# Patient Record
Sex: Female | Born: 1950
Health system: Southern US, Community
[De-identification: ages and names within clinical notes are randomized; demographics above are authoritative.]

## PROBLEM LIST (undated history)

## (undated) DIAGNOSIS — H269 Unspecified cataract: Secondary | ICD-10-CM

## (undated) DIAGNOSIS — E785 Hyperlipidemia, unspecified: Secondary | ICD-10-CM

## (undated) DIAGNOSIS — I10 Essential (primary) hypertension: Secondary | ICD-10-CM

## (undated) DIAGNOSIS — Z90710 Acquired absence of both cervix and uterus: Secondary | ICD-10-CM

## (undated) HISTORY — DX: Acquired absence of both cervix and uterus: Z90.710

## (undated) HISTORY — PX: COLONOSCOPY: SHX174

## (undated) HISTORY — PX: ABDOMINAL HYSTERECTOMY: SHX81

## (undated) HISTORY — DX: Essential (primary) hypertension: I10

## (undated) HISTORY — DX: Hyperlipidemia, unspecified: E78.5

## (undated) HISTORY — DX: Unspecified cataract: H26.9

---

## 1998-05-13 ENCOUNTER — Other Ambulatory Visit: Admission: RE | Admit: 1998-05-13 | Discharge: 1998-05-13 | Payer: Self-pay | Admitting: *Deleted

## 1999-05-27 ENCOUNTER — Encounter: Admission: RE | Admit: 1999-05-27 | Discharge: 1999-05-27 | Payer: Self-pay | Admitting: Obstetrics and Gynecology

## 1999-05-27 ENCOUNTER — Encounter: Payer: Self-pay | Admitting: Obstetrics and Gynecology

## 1999-06-07 ENCOUNTER — Other Ambulatory Visit: Admission: RE | Admit: 1999-06-07 | Discharge: 1999-06-07 | Payer: Self-pay | Admitting: Emergency Medicine

## 2000-05-30 ENCOUNTER — Encounter: Admission: RE | Admit: 2000-05-30 | Discharge: 2000-05-30 | Payer: Self-pay | Admitting: Obstetrics and Gynecology

## 2000-05-30 ENCOUNTER — Encounter: Payer: Self-pay | Admitting: Obstetrics and Gynecology

## 2001-06-14 ENCOUNTER — Encounter: Payer: Self-pay | Admitting: Obstetrics and Gynecology

## 2001-06-14 ENCOUNTER — Encounter: Admission: RE | Admit: 2001-06-14 | Discharge: 2001-06-14 | Payer: Self-pay | Admitting: Obstetrics and Gynecology

## 2002-06-26 ENCOUNTER — Encounter: Payer: Self-pay | Admitting: Obstetrics and Gynecology

## 2002-06-26 ENCOUNTER — Encounter: Admission: RE | Admit: 2002-06-26 | Discharge: 2002-06-26 | Payer: Self-pay | Admitting: Obstetrics and Gynecology

## 2004-07-12 ENCOUNTER — Encounter: Admission: RE | Admit: 2004-07-12 | Discharge: 2004-07-12 | Payer: Self-pay | Admitting: Obstetrics and Gynecology

## 2010-05-29 ENCOUNTER — Encounter: Payer: Self-pay | Admitting: Obstetrics and Gynecology

## 2010-05-30 ENCOUNTER — Encounter: Payer: Self-pay | Admitting: Family Medicine

## 2013-03-25 ENCOUNTER — Ambulatory Visit: Payer: Self-pay | Admitting: Internal Medicine

## 2017-03-21 DIAGNOSIS — R946 Abnormal results of thyroid function studies: Secondary | ICD-10-CM | POA: Diagnosis not present

## 2017-03-21 DIAGNOSIS — R69 Illness, unspecified: Secondary | ICD-10-CM | POA: Diagnosis not present

## 2017-03-21 DIAGNOSIS — E7800 Pure hypercholesterolemia, unspecified: Secondary | ICD-10-CM | POA: Diagnosis not present

## 2017-03-21 DIAGNOSIS — Z780 Asymptomatic menopausal state: Secondary | ICD-10-CM | POA: Diagnosis not present

## 2017-03-21 DIAGNOSIS — I10 Essential (primary) hypertension: Secondary | ICD-10-CM | POA: Diagnosis not present

## 2017-05-16 DIAGNOSIS — Z1231 Encounter for screening mammogram for malignant neoplasm of breast: Secondary | ICD-10-CM | POA: Diagnosis not present

## 2017-05-16 DIAGNOSIS — Z780 Asymptomatic menopausal state: Secondary | ICD-10-CM | POA: Diagnosis not present

## 2017-05-16 DIAGNOSIS — M8589 Other specified disorders of bone density and structure, multiple sites: Secondary | ICD-10-CM | POA: Diagnosis not present

## 2017-06-22 DIAGNOSIS — R946 Abnormal results of thyroid function studies: Secondary | ICD-10-CM | POA: Diagnosis not present

## 2017-06-22 DIAGNOSIS — R945 Abnormal results of liver function studies: Secondary | ICD-10-CM | POA: Diagnosis not present

## 2017-06-22 DIAGNOSIS — E7800 Pure hypercholesterolemia, unspecified: Secondary | ICD-10-CM | POA: Diagnosis not present

## 2018-03-16 DIAGNOSIS — R69 Illness, unspecified: Secondary | ICD-10-CM | POA: Diagnosis not present

## 2018-03-16 DIAGNOSIS — R946 Abnormal results of thyroid function studies: Secondary | ICD-10-CM | POA: Diagnosis not present

## 2018-03-16 DIAGNOSIS — E7800 Pure hypercholesterolemia, unspecified: Secondary | ICD-10-CM | POA: Diagnosis not present

## 2018-03-16 DIAGNOSIS — I10 Essential (primary) hypertension: Secondary | ICD-10-CM | POA: Diagnosis not present

## 2018-05-21 DIAGNOSIS — Z803 Family history of malignant neoplasm of breast: Secondary | ICD-10-CM | POA: Diagnosis not present

## 2018-05-21 DIAGNOSIS — Z1231 Encounter for screening mammogram for malignant neoplasm of breast: Secondary | ICD-10-CM | POA: Diagnosis not present

## 2019-01-01 ENCOUNTER — Encounter: Payer: Self-pay | Admitting: Gastroenterology

## 2019-01-03 ENCOUNTER — Other Ambulatory Visit: Payer: Self-pay

## 2019-01-03 ENCOUNTER — Emergency Department (HOSPITAL_COMMUNITY)
Admission: EM | Admit: 2019-01-03 | Discharge: 2019-01-03 | Disposition: A | Discharge status: Home / Self Care | Payer: Medicare HMO | Attending: Emergency Medicine | Admitting: Emergency Medicine

## 2019-01-03 ENCOUNTER — Encounter (HOSPITAL_COMMUNITY): Payer: Self-pay | Admitting: Emergency Medicine

## 2019-01-03 ENCOUNTER — Emergency Department (HOSPITAL_COMMUNITY): Payer: Medicare HMO

## 2019-01-03 DIAGNOSIS — R030 Elevated blood-pressure reading, without diagnosis of hypertension: Secondary | ICD-10-CM

## 2019-01-03 DIAGNOSIS — K644 Residual hemorrhoidal skin tags: Secondary | ICD-10-CM | POA: Diagnosis not present

## 2019-01-03 DIAGNOSIS — K5900 Constipation, unspecified: Secondary | ICD-10-CM

## 2019-01-03 NOTE — ED Notes (Signed)
Patient verbalizes understanding of discharge instructions . Opportunity for questions and answers were provided . Armband removed by staff ,Pt discharged from ED. W/C  offered at D/C  and Declined W/C at D/C and was escorted to lobby by RN.  

## 2019-01-03 NOTE — Discharge Instructions (Addendum)
It was our pleasure to provide your ER care today - we hope that you feel better.  Drink plenty of fluids. Get adequate fiber in diet. Take colace (stool softener) 2x/day, and miralax (laxative) 1x/day as need - both of these medications are available over the counter at your local pharmacy.   Follow up with primary care doctor in the next 1-2 weeks - also have your blood pressure rechecked then, as it is high today.  Return to ER if worse, new symptoms, severe abdominal pain, persistent vomiting, other concern.

## 2019-01-03 NOTE — ED Provider Notes (Signed)
[facility] EMERGENCY DEPARTMENT Provider Note   CSN: [id number] Arrival date & time: 01/03/19  1107     History   Chief Complaint Chief Complaint  Patient presents with  . Constipation    HPI Kathleen Mccullough is a 68 y.o. female.     Patient c/o problems w constipation in the past few weeks. Notes hx same in past, stating her normal is 2-3 bms per week. States hx hemorrhoids in past, and mild discomfort by anus. Denies new med use or opiate med use. Denies abd pain or distension. Normal appetite. No nausea or vomiting. Last bm was a few days ago, small, sl loose. Is passing gas. Denies dysuria or gu c/o. No fever or chills. Tried a laxative 2 weeks ago for same - did have small bm afterwards. No fever or chills.   The history is provided by the patient.  Constipation Associated symptoms: no abdominal pain, no back pain, no fever and no vomiting     History reviewed. No pertinent past medical history.  There are no active problems to display for this patient.   History reviewed. No pertinent surgical history.   OB History   No obstetric history on file.      Home Medications    Prior to Admission medications   Not on File    Family History No family history on file.  Social History Social History   Tobacco Use  . Smoking status: Not on file  Substance Use Topics  . Alcohol use: Not on file  . Drug use: Not on file     Allergies   Patient has no known allergies.   Review of Systems Review of Systems  Constitutional: Negative for chills and fever.  HENT: Negative for sore throat.   Eyes: Negative for redness.  Respiratory: Negative for shortness of breath.   Cardiovascular: Negative for chest pain.  Gastrointestinal: Positive for constipation. Negative for abdominal distention, abdominal pain and vomiting.  Genitourinary: Negative for flank pain.  Musculoskeletal: Negative for back pain.  Skin: Negative for rash.  Neurological:  Negative for headaches.  Hematological: Does not bruise/bleed easily.  Psychiatric/Behavioral: Negative for confusion.     Physical Exam Updated Vital Signs BP (!) 147/107   Pulse 86   Temp 99.3 F (37.4 C) (Oral)   Resp 18   SpO2 96%   Physical Exam Vitals signs and nursing note reviewed.  Constitutional:      Appearance: Normal appearance. She is well-developed.  HENT:     Head: Atraumatic.     Nose: Nose normal.     Mouth/Throat:     Mouth: Mucous membranes are moist.  Eyes:     General: No scleral icterus.    Conjunctiva/sclera: Conjunctivae normal.  Neck:     Musculoskeletal: Normal range of motion and neck supple. No neck rigidity or muscular tenderness.     Trachea: No tracheal deviation.  Cardiovascular:     Rate and Rhythm: Normal rate.     Pulses: Normal pulses.  Pulmonary:     Effort: Pulmonary effort is normal. No respiratory distress.  Abdominal:     General: Bowel sounds are normal. There is no distension.     Palpations: Abdomen is soft. There is no mass.     Tenderness: There is no abdominal tenderness. There is no guarding.     Hernia: No hernia is present.  Genitourinary:    Comments: No cva tenderness. Rectal, w chaperone (registration), small external hemorrhoid, not acutely  thrombosed. No rectal mass felt. Small amt soft light brown stool, no impaction felt.  Musculoskeletal:        General: No swelling.  Skin:    General: Skin is warm and dry.     Findings: No rash.  Neurological:     Mental Status: She is alert.     Comments: Alert, speech normal. Steady gait.   Psychiatric:        Mood and Affect: Mood normal.      ED Treatments / Results  Labs (all labs ordered are listed, but only abnormal results are displayed) Labs Reviewed - No data to display  EKG None  Radiology Dg Abd 1 View  Result Date: 01/03/2019 CLINICAL DATA:  Constipation.  Abdominal discomfort.  Nausea. EXAM: ABDOMEN - 1 VIEW COMPARISON:  None. FINDINGS: The  bowel gas pattern is normal. No radio-opaque calculi or other significant radiographic abnormality are seen. IMPRESSION: Benign-appearing abdomen. Electronically Signed   By: Lorriane Shire M.D.   On: 01/03/2019 12:17    Procedures Procedures (including critical care time)  Medications Ordered in ED Medications - No data to display   Initial Impression / Assessment and Plan / ED Course  I have reviewed the triage vital signs and the nursing notes.  Pertinent labs & imaging results that were available during my care of the patient were reviewed by me and considered in my medical decision making (see chart for details).  Xrays.  Reviewed nursing notes and prior charts for additional history.   No impaction on rectal.  Xrays reviewed by me - no sbo. Some stool.   Will try colace and miralax rx for constipation. Also pcp f/u as bp is high today.   Return precautions provided.     Final Clinical Impressions(s) / ED Diagnoses   Final diagnoses:  None    ED Discharge Orders    None       Lajean Saver, MD 01/03/19 1355

## 2019-01-03 NOTE — ED Notes (Signed)
Out for imaging. °

## 2019-01-03 NOTE — ED Triage Notes (Signed)
Pt reports one month of consitpation pt has tried OTC with out relief . Pt has called her PCP who is out of office today. Pt reports cholesterol medications cause constipation.

## 2019-01-03 NOTE — ED Triage Notes (Signed)
Pt here from home with c/o constipation , pt has taken several OTC meds with minimal relief

## 2019-02-06 ENCOUNTER — Ambulatory Visit: Payer: Medicare HMO | Admitting: Gastroenterology

## 2019-02-06 ENCOUNTER — Other Ambulatory Visit (INDEPENDENT_AMBULATORY_CARE_PROVIDER_SITE_OTHER): Payer: Medicare HMO

## 2019-02-06 ENCOUNTER — Encounter: Payer: Self-pay | Admitting: Gastroenterology

## 2019-02-06 VITALS — BP 122/80 | HR 75 | Temp 98.3°F | Ht 64.0 in | Wt 157.0 lb

## 2019-02-06 DIAGNOSIS — K649 Unspecified hemorrhoids: Secondary | ICD-10-CM

## 2019-02-06 DIAGNOSIS — K5900 Constipation, unspecified: Secondary | ICD-10-CM

## 2019-02-06 LAB — COMPREHENSIVE METABOLIC PANEL
ALT: 9 U/L (ref 0–35)
AST: 14 U/L (ref 0–37)
Albumin: 4.4 g/dL (ref 3.5–5.2)
Alkaline Phosphatase: 85 U/L (ref 39–117)
BUN: 16 mg/dL (ref 6–23)
CO2: 29 mEq/L (ref 19–32)
Calcium: 10 mg/dL (ref 8.4–10.5)
Chloride: 102 mEq/L (ref 96–112)
Creatinine, Ser: 0.84 mg/dL (ref 0.40–1.20)
GFR: 67.6 mL/min (ref >60.00)
Glucose, Bld: 105 mg/dL — ABNORMAL HIGH (ref 70–99)
Potassium: 3.8 mEq/L (ref 3.5–5.1)
Sodium: 139 mEq/L (ref 135–145)
Total Bilirubin: 0.9 mg/dL (ref 0.2–1.2)
Total Protein: 8 g/dL (ref 6.0–8.3)

## 2019-02-06 LAB — TSH: TSH: 5.4 u[IU]/mL — ABNORMAL HIGH (ref 0.35–4.50)

## 2019-02-06 MED ORDER — NA SULFATE-K SULFATE-MG SULF 17.5-3.13-1.6 GM/177ML PO SOLN: 1.0000 | ORAL | 0 refills | Status: AC

## 2019-02-06 NOTE — Patient Instructions (Addendum)
I have recommended some labs and an x-ray test to further evaluate your constipation.  Please follow a high fiber diet. Drink at least 64 ounces of water every day.  Continue to take Miralax every day. Add a stool bulking agent such as Metamucil. I would expect this to give your stools some more bulk.   We will proceed with colonoscopy for evaluation of your symptoms and given your family history of colon cancer and polyps.   Please call with any questions or concerns prior to that time.   We have given you a sitz marker test today you need to come back and have an xray done on Monday morning.   Fiber Chart  You should be consuming 25-30g of fiber per day and drinking 8 glasses of water to help your bowels move regularly.  In the chart below you can look up how much fiber you are getting in an average day.  If you are not getting enough fiber, you should add a fiber supplement to your diet.  Examples of this include Metamucil, FiberCon and Citrucel.  These can be purchased at your local grocery store or pharmacy.      [email or web address]

## 2019-02-06 NOTE — Progress Notes (Signed)
Referring Provider: Kathyrn Lass, MD Primary Care Physician:  Kathyrn Lass, MD  Reason for Consultation: Hemorrhoids   IMPRESSION:  Change in bowel habits temporally associated with started statin therapy Chronic constipation - improved on Miralax Hemorrhoids with intermittent bleeding Family history of polyps (father) Family history of colon cancer (paternal grandfather) Screening colonoscopy 2015 ([facility])  Longstanding chronic constipation now with a change in bowel habits likely related to statin therapies. No alarm features.  She is due colonoscopy given her family history of colon polyps.  PLAN: Obtain records from [facility] ([facility]) TSH, CMP SitzMark study Drink at least 64 ounces of water every day, follow a high diet Trial of stool bulking agents such as Metamucil  Continue daily Miralax Colonoscopy with two day prep Consider trial of Linzess in the future  I consented the patient discussing the risks, benefits, and alternatives to endoscopic evaluation. In particular, we discussed the risks that include, but are not limited to, reaction to medication, cardiopulmonary compromise, bleeding requiring blood transfusion, aspiration resulting in pneumonia, perforation requiring surgery, lack of diagnosis, severe illness requiring hospitalization, and even death. We reviewed the risk of missed lesion including polyps or even cancer. The patient acknowledges these risks and asks that we proceed.  Please see the "Patient Instructions" section for addition details about the plan.  HPI: Kathleen Mccullough is a 68 y.o. female presents for evaluation of hemorrhoids. 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 Recently lost her job due to coronavirus. She is self-referred. Previously followed by Dr. Watt Climes and had a screening colonoscopy in 2015.   Longstanding history of constipation but she was able to control it with a high fiber diet.  Constipation was exacerbated after starting statins  last year.  Occurred with both Lipitor and Crestor.  It is become so severe that she was recently seen in the emergency room for constipation after not having a bowel movement for several weeks.  An abdominal x-ray at that time was normal.  No impaction noted on rectal exam.  She was treated with Colace and MiraLAX. She was asked to follow-up with her primary care provider as her blood pressure was noted to be elevated.  Baseline bowel habits are 2-3 bowel movements per week. No urge to defecate. Bowel movements are now "scraggly" and small. Associated straining that is exacerbating her hemorrhoids. Now with a daily BM on Miralax, but, she is concerned that it is still not formed. "It's a big glop."  Some blood. No mucous. No history of manual assistance with defecation.  No other associated symptoms. No identified exacerbating or relieving features.   She asked specifically if she might have H pylori after searching her symptoms on the internet.     Does not know birth mother's family history. Paternal grandfather with colon cancer at age 92. Father with colon polyps. No other known family history of colon cancer or polyps. No family history of uterine/endometrial cancer, pancreatic cancer or gastric/stomach cancer.  Abdominal imaging: Abdominal x-ray 01/03/2019: normal.    Past Medical History:  Diagnosis Date  . H/O abdominal hysterectomy     Past Surgical History:  Procedure Laterality Date  . COLONOSCOPY      Current Outpatient Medications  Medication Sig Dispense Refill  . amLODipine (NORVASC) 10 MG tablet Take 10 mg by mouth daily.    . hydrochlorothiazide (HYDRODIURIL) 25 MG tablet Take 12.5 mg by mouth daily.    . propranolol ER (INDERAL LA) 120 MG 24 hr capsule Take 120 mg by  mouth daily.     No current facility-administered medications for this visit.     Allergies as of 02/06/2019  . (No Known Allergies)    History reviewed. No pertinent family history.  Social  History   Socioeconomic History  . Marital status: Married    Spouse name: richard  . Number of children: 2  . Years of education: Not on file  . Highest education level: Not on file  Occupational History  . Not on file  Social Needs  . Financial resource strain: Not on file  . Food insecurity    Worry: Not on file    Inability: Not on file  . Transportation needs    Medical: Not on file    Non-medical: Not on file  Tobacco Use  . Smoking status: Never Smoker  . Smokeless tobacco: Never Used  Substance and Sexual Activity  . Alcohol use: Not Currently  . Drug use: Not Currently  . Sexual activity: Yes  Lifestyle  . Physical activity    Days per week: Not on file    Minutes per session: Not on file  . Stress: Not on file  Relationships  . Social Herbalist on phone: Not on file    Gets together: Not on file    Attends religious service: Not on file    Active member of club or organization: Not on file    Attends meetings of clubs or organizations: Not on file    Relationship status: Not on file  . Intimate partner violence    Fear of current or ex partner: Not on file    Emotionally abused: Not on file    Physically abused: Not on file    Forced sexual activity: Not on file  Other Topics Concern  . Not on file  Social History Narrative  . Not on file    Review of Systems: 12 system ROS is negative except as noted above.   Physical Exam: General:   Alert,  well-nourished, pleasant and cooperative in NAD Head:  Normocephalic and atraumatic. Eyes:  Sclera clear, no icterus.   Conjunctiva pink. Ears:  Normal auditory acuity. Nose:  No deformity, discharge,  or lesions. Mouth:  No deformity or lesions.   Neck:  Supple; no masses or thyromegaly. Lungs:  Clear throughout to auscultation.   No wheezes. Heart:  Regular rate and rhythm; no murmurs. Abdomen:  Soft,nontender, nondistended, normal bowel sounds, no rebound or guarding. No hepatosplenomegaly.    Rectal:  Deferred until the time of the colonoscopy Msk:  Symmetrical. No boney deformities LAD: No inguinal or umbilical LAD Extremities:  No clubbing or edema. Neurologic:  Alert and  oriented x4;  grossly nonfocal Skin:  Intact without significant lesions or rashes. Psych:  Alert and cooperative. Normal mood and affect.    Zanden Colver L. Tarri Glenn, MD, MPH 02/06/2019, 9:33 AM

## 2019-02-11 ENCOUNTER — Telehealth: Payer: Self-pay | Admitting: Gastroenterology

## 2019-02-11 ENCOUNTER — Ambulatory Visit (INDEPENDENT_AMBULATORY_CARE_PROVIDER_SITE_OTHER)
Admission: RE | Admit: 2019-02-11 | Discharge: 2019-02-11 | Disposition: A | Discharge status: Home / Self Care | Payer: Medicare HMO | Source: Ambulatory Visit | Attending: Gastroenterology | Admitting: Gastroenterology

## 2019-02-11 ENCOUNTER — Other Ambulatory Visit: Payer: Self-pay

## 2019-02-11 DIAGNOSIS — K649 Unspecified hemorrhoids: Secondary | ICD-10-CM | POA: Diagnosis not present

## 2019-02-11 DIAGNOSIS — K5900 Constipation, unspecified: Secondary | ICD-10-CM

## 2019-02-11 NOTE — Telephone Encounter (Signed)
Called patient back, answered all questions, verbalized understanding. No other questions or concerns voiced by the conclusion of the call.

## 2019-02-22 DIAGNOSIS — E7800 Pure hypercholesterolemia, unspecified: Secondary | ICD-10-CM | POA: Diagnosis not present

## 2019-02-22 DIAGNOSIS — R946 Abnormal results of thyroid function studies: Secondary | ICD-10-CM | POA: Diagnosis not present

## 2019-02-22 DIAGNOSIS — R7309 Other abnormal glucose: Secondary | ICD-10-CM | POA: Diagnosis not present

## 2019-02-25 DIAGNOSIS — H2513 Age-related nuclear cataract, bilateral: Secondary | ICD-10-CM | POA: Diagnosis not present

## 2019-02-25 DIAGNOSIS — H524 Presbyopia: Secondary | ICD-10-CM | POA: Diagnosis not present

## 2019-02-25 DIAGNOSIS — H5203 Hypermetropia, bilateral: Secondary | ICD-10-CM | POA: Diagnosis not present

## 2019-02-28 ENCOUNTER — Encounter: Payer: Self-pay | Admitting: Gastroenterology

## 2019-03-04 ENCOUNTER — Telehealth: Payer: Self-pay

## 2019-03-04 NOTE — Telephone Encounter (Signed)
Covid-19 screening questions   Do you now or have you had a fever in the last 14 days?  Do you have any respiratory symptoms of shortness of breath or cough now or in the last 14 days?  Do you have any family members or close contacts with diagnosed or suspected Covid-19 in the past 14 days?  Have you been tested for Covid-19 and found to be positive?       

## 2019-03-05 ENCOUNTER — Ambulatory Visit (AMBULATORY_SURGERY_CENTER): Payer: Medicare HMO | Admitting: Gastroenterology

## 2019-03-05 ENCOUNTER — Other Ambulatory Visit: Payer: Self-pay

## 2019-03-05 ENCOUNTER — Encounter: Payer: Self-pay | Admitting: Gastroenterology

## 2019-03-05 VITALS — BP 117/79 | HR 55 | Temp 98.6°F | Resp 12 | Ht 64.0 in | Wt 157.0 lb

## 2019-03-05 DIAGNOSIS — K635 Polyp of colon: Secondary | ICD-10-CM | POA: Diagnosis not present

## 2019-03-05 DIAGNOSIS — R194 Change in bowel habit: Secondary | ICD-10-CM | POA: Diagnosis not present

## 2019-03-05 DIAGNOSIS — K5900 Constipation, unspecified: Secondary | ICD-10-CM

## 2019-03-05 DIAGNOSIS — D122 Benign neoplasm of ascending colon: Secondary | ICD-10-CM

## 2019-03-05 MED ORDER — SODIUM CHLORIDE 0.9 % IV SOLN: 500.0000 mL | Freq: Once | INTRAVENOUS | Status: AC

## 2019-03-05 NOTE — Progress Notes (Signed)
Called to room to assist during endoscopic procedure.  Patient ID and intended procedure confirmed with present staff. Received instructions for my participation in the procedure from the performing physician.  

## 2019-03-05 NOTE — Progress Notes (Signed)
To PACU, VSS. Report to [email or web address] 

## 2019-03-05 NOTE — Patient Instructions (Signed)
Read all of the handouts given to you by your recovery room nurse.  You will need another colonoscopy in 5 years.Use your miralax as before.  Try to use bebefiber with at least 64oz of water per day.  YOU HAD AN ENDOSCOPIC PROCEDURE TODAY AT [facility]:   Refer to the procedure report that was given to you for any specific questions about what was found during the examination.  If the procedure report does not answer your questions, please call your gastroenterologist to clarify.  If you requested that your care partner not be given the details of your procedure findings, then the procedure report has been included in a sealed envelope for you to review at your convenience later.  YOU SHOULD EXPECT: Some feelings of bloating in the abdomen. Passage of more gas than usual.  Walking can help get rid of the air that was put into your GI tract during the procedure and reduce the bloating. If you had a lower endoscopy (such as a colonoscopy or flexible sigmoidoscopy) you may notice spotting of blood in your stool or on the toilet paper. If you underwent a bowel prep for your procedure, you may not have a normal bowel movement for a few days.  Please Note:  You might notice some irritation and congestion in your nose or some drainage.  This is from the oxygen used during your procedure.  There is no need for concern and it should clear up in a day or so.  SYMPTOMS TO REPORT IMMEDIATELY:   Following lower endoscopy (colonoscopy or flexible sigmoidoscopy):  Excessive amounts of blood in the stool  Significant tenderness or worsening of abdominal pains  Swelling of the abdomen that is new, acute  Fever of 100F or higher   For urgent or emergent issues, a gastroenterologist can be reached at any hour by calling [phone].   DIET:  We do recommend a small meal at first, but then you may proceed to your regular diet.  Drink plenty of fluids but you should avoid alcoholic beverages  for 24 hours.  Try to eat a high fiber diet.  ACTIVITY:  You should plan to take it easy for the rest of today and you should NOT DRIVE or use heavy machinery until tomorrow (because of the sedation medicines used during the test).    FOLLOW UP: Our staff will call the number listed on your records 48-72 hours following your procedure to check on you and address any questions or concerns that you may have regarding the information given to you following your procedure. If we do not reach you, we will leave a message.  We will attempt to reach you two times.  During this call, we will ask if you have developed any symptoms of COVID 19. If you develop any symptoms (ie: fever, flu-like symptoms, shortness of breath, cough etc.) before then, please call [phone].  If you test positive for Covid 19 in the 2 weeks post procedure, please call and report this information to [place].    If any biopsies were taken you will be contacted by phone or by letter within the next 1-3 weeks.  Please call us at [phone] if you have not heard about the biopsies in 3 weeks.    SIGNATURES/CONFIDENTIALITY: You and/or your care partner have signed paperwork which will be entered into your electronic medical record.  These signatures attest to the fact that that the information above on your After Visit Summary has  been reviewed and is understood.  Full responsibility of the confidentiality of this discharge information lies with you and/or your care-partner.

## 2019-03-05 NOTE — Progress Notes (Signed)
TEMP-LC  VITAL SIGNS-CW

## 2019-03-05 NOTE — Op Note (Addendum)
[facility] Patient Name: Donie Calton Procedure Date: 03/05/2019 10:38 AM MRN: [id number] Endoscopist: Thornton Park MD, MD Age: 68 Referring MD:  Date of Birth: 12/16/1950 Gender: Female Account #: [id number] Procedure:                Colonoscopy Indications:              Change in bowel habits temporally associated with                            started statin therapy                           Chronic constipation - improved on Miralax                           Hemorrhoids with intermittent bleeding                           Family history of polyps (father)                           Family history of colon cancer (paternal                            grandfather)                           Screening colonoscopy 2015 ([facility]) Medicines:                See the Anesthesia note for documentation of the                            administered medications Procedure:                Pre-Anesthesia Assessment:                           - Prior to the procedure, a History and Physical                            was performed, and patient medications and                            allergies were reviewed. The patient's tolerance of                            previous anesthesia was also reviewed. The risks                            and benefits of the procedure and the sedation                            options and risks were discussed with the patient.                            All questions were answered, and informed consent  was obtained. Prior Anticoagulants: The patient has                            taken no previous anticoagulant or antiplatelet                            agents. ASA Grade Assessment: III - A patient with                            severe systemic disease. After reviewing the risks                            and benefits, the patient was deemed in                            satisfactory condition to undergo the procedure.                          After obtaining informed consent, the colonoscope                            was passed under direct vision. Throughout the                            procedure, the patient's blood pressure, pulse, and                            oxygen saturations were monitored continuously. The                            Colonoscope was introduced through the anus and                            advanced to the the terminal ileum, with                            identification of the appendiceal orifice and IC                            valve. A second forward view of the right colon was                            performed. The colonoscopy was performed without                            difficulty. The patient tolerated the procedure                            well. The quality of the bowel preparation was                            good. The terminal ileum, ileocecal valve,  appendiceal orifice, and rectum were photographed. Scope In: 11:00:10 AM Scope Out: 11:15:35 AM Scope Withdrawal Time: 0 hours 11 minutes 43 seconds  Total Procedure Duration: 0 hours 15 minutes 25 seconds  Findings:                 An anal fissure was found on perianal exam.                           A few small-mouthed diverticula were found in the                            sigmoid colon.                           A 2 mm polyp was found in the ascending colon. The                            polyp was sessile. The polyp was removed with a                            cold snare. Resection and retrieval were complete.                            Estimated blood loss: none.                           A 2 mm polyp was found in the distal ascending                            colon. The polyp was sessile. Polypectomy was                            attempted, initially using a cold snare. Polyp                            resection was incomplete with this device. This                             intervention then required a different device and                            polypectomy technique. The base of the polyp was                            then removed with a cold biopsy forceps. Resection                            and retrieval were complete.                           The exam was otherwise without abnormality on                            direct and retroflexion views. Complications:  No immediate complications. Estimated blood loss:                            Minimal. Estimated Blood Loss:     Estimated blood loss was minimal. Impression:               - Healed posterior anal fissure found on perianal                            exam.                           - Diverticulosis in the sigmoid colon.                           - One 2 mm polyp in the ascending colon, removed                            with a cold snare. Resected and retrieved.                           - One 2 mm polyp in the distal ascending colon,                            removed with a cold biopsy forceps. Resected and                            retrieved.                           - The examination was otherwise normal on direct                            and retroflexion views. Recommendation:           - Patient has a contact number available for                            emergencies. The signs and symptoms of potential                            delayed complications were discussed with the                            patient. Return to normal activities tomorrow.                            Written discharge instructions were provided to the                            patient.                           - High fiber diet. Drink at least 64 ounces of  water daily. Use Metamucil or an equivalent stool                            bulking agent every day.                           - Continue present medications including Miralax.                           -  Await pathology results.                           - Repeat colonoscopy in 5 years for surveillance                            given the family history regardless of the                            pathology results. Thornton Park MD, MD 03/05/2019 11:29:05 AM This report has been signed electronically.

## 2019-03-07 ENCOUNTER — Telehealth: Payer: Self-pay

## 2019-03-07 NOTE — Telephone Encounter (Signed)
  Follow up Call-  Call back number 03/05/2019  Post procedure Call Back phone  # [phone]  Permission to leave phone message Yes  Some recent data might be hidden     Patient questions:  Do you have a fever, pain , or abdominal swelling? No. Pain Score  0 *  Have you tolerated food without any problems? Yes.    Have you been able to return to your normal activities? Yes.    Do you have any questions about your discharge instructions: Diet   No. Medications  No. Follow up visit  No.  Do you have questions or concerns about your Care? No.  Actions: * If pain score is 4 or above: No action needed, pain <4.   1. Have you developed a fever since your procedure? no  2.   Have you had an respiratory symptoms (SOB or cough) since your procedure? no  3.   Have you tested positive for COVID 19 since your procedure no  4.   Have you had any family members/close contacts diagnosed with the COVID 19 since your procedure?  no   If yes to any of these questions please route to Joylene John, RN and Alphonsa Gin, Therapist, sports.

## 2019-03-12 DIAGNOSIS — R69 Illness, unspecified: Secondary | ICD-10-CM | POA: Diagnosis not present

## 2019-03-12 DIAGNOSIS — E663 Overweight: Secondary | ICD-10-CM | POA: Diagnosis not present

## 2019-03-12 DIAGNOSIS — E7800 Pure hypercholesterolemia, unspecified: Secondary | ICD-10-CM | POA: Diagnosis not present

## 2019-03-12 DIAGNOSIS — I10 Essential (primary) hypertension: Secondary | ICD-10-CM | POA: Diagnosis not present

## 2019-03-29 ENCOUNTER — Other Ambulatory Visit: Payer: Self-pay | Admitting: *Deleted

## 2019-03-29 DIAGNOSIS — R7989 Other specified abnormal findings of blood chemistry: Secondary | ICD-10-CM

## 2019-03-29 DIAGNOSIS — E7800 Pure hypercholesterolemia, unspecified: Secondary | ICD-10-CM

## 2019-05-06 ENCOUNTER — Other Ambulatory Visit: Payer: Self-pay

## 2019-05-07 ENCOUNTER — Ambulatory Visit (INDEPENDENT_AMBULATORY_CARE_PROVIDER_SITE_OTHER): Payer: Medicare HMO | Admitting: Internal Medicine

## 2019-05-07 ENCOUNTER — Encounter: Payer: Self-pay | Admitting: Internal Medicine

## 2019-05-07 VITALS — BP 150/100 | HR 71 | Ht 64.0 in | Wt 156.0 lb

## 2019-05-07 DIAGNOSIS — E039 Hypothyroidism, unspecified: Secondary | ICD-10-CM

## 2019-05-07 DIAGNOSIS — E7800 Pure hypercholesterolemia, unspecified: Secondary | ICD-10-CM

## 2019-05-07 DIAGNOSIS — E038 Other specified hypothyroidism: Secondary | ICD-10-CM

## 2019-05-07 LAB — T4, FREE: Free T4: 1.05 ng/dL (ref 0.60–1.60)

## 2019-05-07 LAB — TSH: TSH: 4.1 u[IU]/mL (ref 0.35–4.50)

## 2019-05-07 LAB — T3, FREE: T3, Free: 2.7 pg/mL (ref 2.3–4.2)

## 2019-05-07 NOTE — Patient Instructions (Addendum)
Please stop at the lab.  If the thyroid antibodies are high, you can try Selenium 200 mcg daily.  If we decide for levothyroxine, take every day, with water, at least 30 minutes before breakfast, separated by at least 4 hours from: - acid reflux medications - calcium - iron - multivitamins  Please think about Nexletol.  Please come back for a follow-up appointment in 4 months.  Bempedoic acid tablets What is this medicine? Bempedoic acid (BEM pe DOE ik AS id) is used to lower the level of cholesterol in the blood. It is used with other cholesterol-lowering drugs. This medicine may be used for other purposes; ask your health care provider or pharmacist if you have questions. COMMON BRAND NAME(S): NEXLETOL What should I tell my health care provider before I take this medicine? They need to know if you have any of these conditions:  gout  kidney problems  liver problems  tendon problems  an unusual or allergic reaction to bempedoic acid, other medicines, foods, dyes, or preservatives  pregnant or trying to become pregnant  breast-feeding How should I use this medicine? Take this medicine by mouth with a glass of water. Follow the directions on the prescription label. You can take it with or without food. If it upsets your stomach, take it with food. Take your doses at regular intervals. Do not take your medicine more often than directed. Talk to your pediatrician about the use of this medicine in children. Special care may be needed. Overdosage: If you think you have taken too much of this medicine contact a poison control center or emergency room at once. NOTE: This medicine is only for you. Do not share this medicine with others. What if I miss a dose? If you miss a dose, take it as soon as you can. If it is almost time for your next dose, take only that dose. Do not take double or extra doses. What may interact with this medicine? This medicine may interact with the  following medications:  simvastatin  pravastatin This list may not describe all possible interactions. Give your health care provider a list of all the medicines, herbs, non-prescription drugs, or dietary supplements you use. Also tell them if you smoke, drink alcohol, or use illegal drugs. Some items may interact with your medicine. What should I watch for while using this medicine? Visit your health care professional for regular checks on your progress. Tell your health care professional if your symptoms do not start to get better or if they get worse. You may need blood work done while you are taking this medicine. This drug is only part of a total heart-health program. Your doctor or a dietician can suggest a low-cholesterol and low-fat diet to help. Avoid alcohol and smoking, and keep a proper exercise schedule. What side effects may I notice from receiving this medicine? Side effects that you should report to your doctor or health care professional as soon as possible:  allergic reactions like skin rash, itching or hives, swelling of the face, lips, or tongue  signs of gout such as swollen, red, warm, or tender joints, especially in the toes  signs of tendon problems such as tendon pain or swelling or if you are unable to move a joint Side effects that usually do not require medical attention (report these to your doctor or health care professional if they continue or are bothersome):  back pain  cold or flu-like symptoms  muscle spasms  stomach pain This list  may not describe all possible side effects. Call your doctor for medical advice about side effects. You may report side effects to FDA at 1-800-FDA-1088. Where should I keep my medicine? Keep out of the reach of children. Store at room temperature between 20 and 25 degrees C (68 and 77 degrees F). Keep this medicine in the original container. Do not throw out the packet in the container. It keeps the medicine dry. Throw away  any unused medication after the expiration date. NOTE: This sheet is a summary. It may not cover all possible information. If you have questions about this medicine, talk to your doctor, pharmacist, or health care provider.  2020 Elsevier/Gold Standard (2018-07-04 16:19:16)

## 2019-05-07 NOTE — Progress Notes (Signed)
Patient ID: Kathleen Mccullough, female   DOB: 04/26/1951, 68 y.o.   MRN: [id number]   This visit occurred during the SARS-CoV-2 public health emergency.  Safety protocols were in place, including screening questions prior to the visit, additional usage of staff PPE, and extensive cleaning of exam room while observing appropriate contact time as indicated for disinfecting solutions.   HPI  Kathleen Mccullough is a 68 y.o.-year-old female, referred by her PCP, Dr. Tarri Glenn, for management of subclinical hypothyroidism and  also for hyperlipidemia.  Subclinical hypothyroidism:  Pt. has been found to have a slightly high TSH dating back to approximately 2014.  She is not on levothyroxine.  I reviewed pt's thyroid tests per records brought by patient and available records in Epic: 02/22/2019: TSH 6.14, free T4 1.00 Lab Results  Component Value Date   TSH 5.40 (H) 02/06/2019  03/21/2017: TSH 6.43 02/04/2014: TPO Abs 109 (0-34) 02/28/2013: TSH 5.33  Antithyroid antibodies: No results found for: THGAB No components found for: TPOAB  Pt describes: - + fatigue - + constipation-significant when she tried statins - no weight gain - no cold intolerance - no depression - no dry skin - no hair loss  Pt denies feeling nodules in neck, hoarseness, dysphagia/odynophagia, SOB with lying down.  She has no FH of thyroid disorders. No FH of thyroid cancer.  No h/o radiation tx to head or neck. No recent use of iodine supplements.  Hyperlipidemia:  She is not on a statin >> she developed constipation: Lipitor, Crestor, Pravachol. She had to go to the ED for fecal impaction few mo ago. Colonoscopy: normal, except few polyps. Zetia was recently mentioned by PCP but she is reticent to try.   02/22/2019: 271/160/58/181  On Lipitor, her LDL decreased to the 80s in the past.  Her last HbA1c was 5.1% 02/22/2019.  She has a h/o HTN, osteopenia (05/16/2017: T-score -1.9), vit D insufficiency: 02/28/2013: vit D  25.6, but most recent level has been 40 reportedly.  She also has a h/o carpal tunnel.  She has a hysterectomy + 1 ovary excised at 29 for DUB.  ROS: Constitutional: no weight gain/loss, no fatigue, no subjective hyperthermia/hypothermia Eyes: no blurry vision, no xerophthalmia ENT: no sore throat, no nodules palpated in throat, no dysphagia/odynophagia, no hoarseness Cardiovascular: no CP/SOB/palpitations/leg swelling Respiratory: no cough/SOB Gastrointestinal: no N/V/D/+ C Musculoskeletal: +  Muscle soreness/no joint aches Skin: no rashes Neurological: no tremors/numbness/tingling/dizziness Psychiatric: no depression/anxiety  Past Medical History:  Diagnosis Date  . Cataract    BEGINNING STAGES BILATERAL  . H/O abdominal hysterectomy   . Hyperlipidemia   . Hypertension    Past Surgical History:  Procedure Laterality Date  . ABDOMINAL HYSTERECTOMY    . COLONOSCOPY     Social History   Socioeconomic History  . Marital status: Married    Spouse name: richard  . Number of children: 2  . Years of education: Not on file  . Highest education level: Not on file  Occupational History  .  unemployed  Tobacco Use  . Smoking status: Never Smoker  . Smokeless tobacco: Never Used  Substance and Sexual Activity  . Alcohol use: Not Currently  . Drug use: Not Currently  . Sexual activity: Yes  Other Topics Concern  . Not on file  Social History Narrative  . Not on file   Social Determinants of Health   Financial Resource Strain:   . Difficulty of Paying Living Expenses: Not on file  Food Insecurity:   . Worried About  Running Out of Food in the Last Year: Not on file  . Ran Out of Food in the Last Year: Not on file  Transportation Needs:   . Lack of Transportation (Medical): Not on file  . Lack of Transportation (Non-Medical): Not on file  Physical Activity:   . Days of Exercise per Week: Not on file  . Minutes of Exercise per Session: Not on file  Stress:   . Feeling  of Stress : Not on file  Social Connections:   . Frequency of Communication with Friends and Family: Not on file  . Frequency of Social Gatherings with Friends and Family: Not on file  . Attends Religious Services: Not on file  . Active Member of Clubs or Organizations: Not on file  . Attends Archivist Meetings: Not on file  . Marital Status: Not on file  Intimate Partner Violence:   . Fear of Current or Ex-Partner: Not on file  . Emotionally Abused: Not on file  . Physically Abused: Not on file  . Sexually Abused: Not on file   Current Outpatient Medications on File Prior to Visit  Medication Sig Dispense Refill  . amLODipine (NORVASC) 10 MG tablet Take 10 mg by mouth daily.    . hydrochlorothiazide (HYDRODIURIL) 25 MG tablet Take 12.5 mg by mouth daily.    . propranolol ER (INDERAL LA) 120 MG 24 hr capsule Take 120 mg by mouth daily.     Current Facility-Administered Medications on File Prior to Visit  Medication Dose Route Frequency Provider Last Rate Last Admin  . 0.9 %  sodium chloride infusion  500 mL Intravenous Once [place], MD       No Known Allergies Family History  Problem Relation Age of Onset  . Hypertension Father   . Hyperlipidemia Father   . Colon cancer Paternal Grandfather   . Colon cancer Cousin   . Esophageal cancer Neg Hx   . Rectal cancer Neg Hx   . Stomach cancer Neg Hx    PE: BP (!) 150/100   Pulse 71   Ht 5\' 4"  (1.626 m)   Wt 156 lb (70.8 kg)   SpO2 98%   BMI 26.78 kg/m  Wt Readings from Last 3 Encounters:  05/07/19 156 lb (70.8 kg)  03/05/19 157 lb (71.2 kg)  02/06/19 157 lb (71.2 kg)   Constitutional: overweight, in NAD Eyes: PERRLA, EOMI, no exophthalmos ENT: moist mucous membranes, no thyromegaly, no cervical lymphadenopathy Cardiovascular: RRR, No MRG Respiratory: CTA B Gastrointestinal: abdomen soft, NT, ND, BS+ Musculoskeletal: no deformities, strength intact in all 4 Skin: moist, warm, no  rashes Neurological: no tremor with outstretched hands, DTR normal in all 4  ASSESSMENT: 1.  Subclinical hypothyroidism -Due to Hashimoto's thyroiditis  2. HL  PLAN:  1. Patient with long-standing subclinical hypothyroidism, with fairly stable TSH levels slightly above the upper limit of normal and normal free thyroid hormones.  She had testing for Hashimoto's thyroiditis in 2016 and her TPO antibodies were elevated. - she appears euthyroid.  She mentions that her fatigue is mild and chronic.  She has constipation but especially when she takes statins.  No other possibly hypothyroid symptoms. - she does not appear to have a goiter, thyroid nodules, or neck compression symptoms -At this visit, we discussed about Hashimoto's thyroiditis as an autoimmune disease in which her immune system impacts her own thyroid.  We discussed that distraction of the thyroid will ultimately occur but the timing of this process is extremely  subjective.  Her TFTs have been stable now for more than 6 years.  This is a sign that her Hashimoto's thyroiditis is very indolent. -We discussed about ways to improve her antibodies including improved diet, exercise, relaxation, increased sleep, and also, selenium. -At this visit, we decided to check her TFTs (TSH, free T4, free T3) and also add TPO and ATA antibodies.  If these are elevated, I would suggest to start selenium.  Alternatively, if the antibodies are not elevated, we also have the option of starting a low-dose levothyroxine to see if she feels better from the point of view of fatigue and constipation. We discussed about correct intake of levothyroxine in case she needs to start, fasting, with water, separated by at least 30 minutes from breakfast, and separated by more than 4 hours from calcium, iron, multivitamins, acid reflux medications (PPIs). -She agrees with the plan - I will see her back in 4 months  2. HL -Patient has tried all the statins available some of  them twice, with still the same effect: Constipation.  This has been so severe that at 1 point she had to go to the emergency room for fecal impaction.  She would not want to try another statin. -Her PCP suggested ezetimibe but she is also reticent to try this due to the possible constipation -At this visit, I suggested a change in diet to reduce cholesterol and animal proteins and transition to a more plant-based diet. We discussed about ideal targets for LDL cholesterol and also total cholesterol -I also suggested Bempedoic acid but she is undecided about his.  Given written information about the medication and she will let me know if she wants to try it.  Another option would be referral to the lipid clinic for a PCSK9 inhibitor: Praluent or Repatha.  Orders Placed This Encounter  Procedures  . xtpit - TSH  . xtpit - free T4  . xtpit - free T3  . TPO Ab - thyroid  . ATA - ThyCa   Component     Latest Ref Rng & Units 05/07/2019  TSH     0.35 - 4.50 uIU/mL 4.10  T4,Free(Direct)     0.60 - 1.60 ng/dL 1.05  Triiodothyronine,Free,Serum     2.3 - 4.2 pg/mL 2.7  Thyroperoxidase Ab SerPl-aCnc     <9 IU/mL 85 (H)  Thyroglobulin Ab     < or = 1 IU/mL <1   TPO antibodies are elevated.  She can try selenium 200 mcg daily.  Rest of the tests are normal.  Philemon Kingdom, MD PhD [facility]

## 2019-05-08 LAB — THYROGLOBULIN ANTIBODY: Thyroglobulin Ab: <1 IU/mL (ref <=1)

## 2019-05-08 LAB — THYROID PEROXIDASE ANTIBODY: Thyroperoxidase Ab SerPl-aCnc: 85 IU/mL — ABNORMAL HIGH (ref <9)

## 2019-05-09 ENCOUNTER — Encounter: Payer: Self-pay | Admitting: Internal Medicine

## 2019-05-18 ENCOUNTER — Encounter: Payer: Self-pay | Admitting: Internal Medicine

## 2019-05-27 DIAGNOSIS — Z1231 Encounter for screening mammogram for malignant neoplasm of breast: Secondary | ICD-10-CM | POA: Diagnosis not present

## 2019-06-25 ENCOUNTER — Encounter: Payer: Self-pay | Admitting: Internal Medicine

## 2019-07-01 DIAGNOSIS — R69 Illness, unspecified: Secondary | ICD-10-CM | POA: Diagnosis not present

## 2019-07-06 ENCOUNTER — Encounter: Payer: Self-pay | Admitting: Internal Medicine

## 2019-07-08 ENCOUNTER — Other Ambulatory Visit: Payer: Self-pay | Admitting: Internal Medicine

## 2019-07-08 DIAGNOSIS — E038 Other specified hypothyroidism: Secondary | ICD-10-CM

## 2019-07-08 DIAGNOSIS — E039 Hypothyroidism, unspecified: Secondary | ICD-10-CM

## 2019-07-08 MED ORDER — LEVOTHYROXINE SODIUM 25 MCG PO TABS: 25.0000 ug | ORAL_TABLET | Freq: Every day | ORAL | 0 refills | Status: DC

## 2019-07-12 ENCOUNTER — Ambulatory Visit: Payer: Medicare HMO | Attending: Internal Medicine

## 2019-07-12 DIAGNOSIS — Z23 Encounter for immunization: Secondary | ICD-10-CM | POA: Insufficient documentation

## 2019-07-12 NOTE — Progress Notes (Signed)
   Covid-19 Vaccination Clinic  Name:  Kathleen Mccullough    MRN: [id number] DOB: 08-Jan-1951  07/12/2019  [name] was observed post Covid-19 immunization for 15 minutes without incident. She was provided with Vaccine Information Sheet and instruction to access the V-Safe system.   [name] was instructed to call 911 with any severe reactions post vaccine: Marland Kitchen Difficulty breathing  . Swelling of face and throat  . A fast heartbeat  . A bad rash all over body  . Dizziness and weakness

## 2019-08-01 DIAGNOSIS — R69 Illness, unspecified: Secondary | ICD-10-CM | POA: Diagnosis not present

## 2019-08-06 ENCOUNTER — Other Ambulatory Visit: Payer: Self-pay | Admitting: Internal Medicine

## 2019-08-07 ENCOUNTER — Ambulatory Visit: Payer: Medicare HMO | Attending: Internal Medicine

## 2019-08-07 DIAGNOSIS — Z23 Encounter for immunization: Secondary | ICD-10-CM

## 2019-08-07 NOTE — Progress Notes (Signed)
   Covid-19 Vaccination Clinic  Name:  Khaza Zimpfer    MRN: [id number] DOB: May 13, 1950  08/07/2019  Ms. Bashor was observed post Covid-19 immunization for 15 minutes without incident. She was provided with Vaccine Information Sheet and instruction to access the V-Safe system.   Ms. Rittenberg was instructed to call 911 with any severe reactions post vaccine: Marland Kitchen Difficulty breathing  . Swelling of face and throat  . A fast heartbeat  . A bad rash all over body  . Dizziness and weakness   Immunizations Administered    Name Date Dose VIS Date Route   Pfizer COVID-19 Vaccine 08/07/2019 12:54 PM 0.3 mL 04/19/2019 Intramuscular   Manufacturer: [place]   Lot: [id number]   [place]: [id number]

## 2019-09-06 ENCOUNTER — Other Ambulatory Visit: Payer: Self-pay

## 2019-09-10 ENCOUNTER — Other Ambulatory Visit: Payer: Self-pay

## 2019-09-10 ENCOUNTER — Ambulatory Visit (INDEPENDENT_AMBULATORY_CARE_PROVIDER_SITE_OTHER): Payer: Medicare HMO | Admitting: Internal Medicine

## 2019-09-10 ENCOUNTER — Ambulatory Visit: Payer: Medicare HMO | Admitting: Internal Medicine

## 2019-09-10 ENCOUNTER — Encounter: Payer: Self-pay | Admitting: Internal Medicine

## 2019-09-10 VITALS — BP 130/80 | HR 62 | Ht 64.0 in | Wt 159.0 lb

## 2019-09-10 DIAGNOSIS — E039 Hypothyroidism, unspecified: Secondary | ICD-10-CM | POA: Diagnosis not present

## 2019-09-10 DIAGNOSIS — R69 Illness, unspecified: Secondary | ICD-10-CM | POA: Diagnosis not present

## 2019-09-10 DIAGNOSIS — E7800 Pure hypercholesterolemia, unspecified: Secondary | ICD-10-CM | POA: Diagnosis not present

## 2019-09-10 DIAGNOSIS — E038 Other specified hypothyroidism: Secondary | ICD-10-CM

## 2019-09-10 LAB — LIPID PANEL
Cholesterol: 241 mg/dL — ABNORMAL HIGH (ref 0–200)
HDL: 55.7 mg/dL (ref >39.00)
LDL Cholesterol: 150 mg/dL — ABNORMAL HIGH (ref 0–99)
NonHDL: 185.32
Total CHOL/HDL Ratio: 4
Triglycerides: 176 mg/dL — ABNORMAL HIGH (ref 0.0–149.0)
VLDL: 35.2 mg/dL (ref 0.0–40.0)

## 2019-09-10 LAB — TSH: TSH: 3.91 u[IU]/mL (ref 0.35–4.50)

## 2019-09-10 LAB — T4, FREE: Free T4: 1.45 ng/dL (ref 0.60–1.60)

## 2019-09-10 NOTE — Patient Instructions (Addendum)
Please stop at the lab.  Continue Selenium 200 mcg daily.  Please continue Levothyroxine 25 mcg daily  Take  Levothyroxine  every day, with water, at least 30 minutes before breakfast, separated by at least 4 hours from: - acid reflux medications - calcium - iron - multivitamins  Please come back for a follow-up appointment in 6 months.

## 2019-09-10 NOTE — Progress Notes (Signed)
Patient ID: Kathleen Mccullough, female   DOB: 1951/02/27, 69 y.o.   MRN: [id number]   This visit occurred during the SARS-CoV-2 public health emergency.  Safety protocols were in place, including screening questions prior to the visit, additional usage of staff PPE, and extensive cleaning of exam room while observing appropriate contact time as indicated for disinfecting solutions.   HPI  Kathleen Mccullough is a 69 y.o.-year-old female, initially referred by her PCP, Dr. Tarri Glenn, returning for follow-up for subclinical hypothyroidism and  also for hyperlipidemia.  Last visit 4 months ago.  Subclinical hypothyroidism:  She was found to have a slightly high TSH in 2014.    After last visit, although her TSH was close to the upper limit of normal, she wanted to try a low-dose levothyroxine.  Pt is on levothyroxine 25 mcg daily, started at the end of 06/2019, taken: - in am - fasting - at least 30 min from b'fast - no Ca, Fe, PPIs - + MVI rarely around supper - not on Biotin  Reviewed available TFTs: Lab Results  Component Value Date   TSH 4.10 05/07/2019   TSH 5.40 (H) 02/06/2019   FREET4 1.05 05/07/2019   T3FREE 2.7 05/07/2019  02/22/2019: TSH 6.14, free T4 1.00 03/21/2017: TSH 6.43 02/28/2013: TSH 5.33  Antithyroid antibodies were positive: Component     Latest Ref Rng & Units 05/07/2019  Thyroperoxidase Ab SerPl-aCnc     <9 IU/mL 85 (H)  Thyroglobulin Ab     < or = 1 IU/mL <1  02/04/2014: TPO Abs 109 (0-34)  We started selenium 200 mcg daily in 04/2019.  At last visit she described fatigue and constipation.  The symptoms are chronic and still present.  Fatigue is slightly better after she started levothyroxine, but not significantly so.  Pt denies: - feeling nodules in neck - hoarseness - dysphagia - choking - SOB with lying down  No FH of thyroid disease or thyroid cancer. No h/o radiation tx to head or neck.  No seaweed or kelp. No recent contrast studies. No herbal  supplements. No Biotin use. No recent steroids use.   Hyperlipidemia: - Her LDL responded well to Lipitor, decreasing to the 80s in the past. -However, she is not on a statin due to severe constipation: In the past, she tried Lipitor, Crestor, Pravachol.  She had to go to the ED for fecal impaction. Colonoscopy: normal, except few polyps.   Zetia was mentioned by PCP but she was reticent to try.  02/22/2019: 271/160/58/181 No results found for: CHOL No results found for: HDL No results found for: LDLCALC No results found for: TRIG No results found for: CHOLHDL No results found for: LDLDIRECT  At last visit, we discussed about Bempedoic acid, but she was afraid that this was too expensive.  She also refused a referral to the lipid clinic at that time.  Her last HbA1c was 5.1% 02/22/2019.  She has a h/o HTN, osteopenia (05/16/2017: T-score -1.9), vit D insufficiency: 02/28/2013: vit D 25.6, but most recent level has been 40 reportedly.  She also has a h/o carpal tunnel.  She has a hysterectomy + 1 ovary excised at 69 for DUB.  ROS: Constitutional: no weight gain/no weight loss, no fatigue, no subjective hyperthermia, no subjective hypothermia Eyes: no blurry vision, no xerophthalmia ENT: no sore throat, + see HPI Cardiovascular: no CP/no SOB/no palpitations/no leg swelling Respiratory: no cough/no SOB/no wheezing Gastrointestinal: no N/no V/no D/no C/no acid reflux Musculoskeletal: no muscle aches/no joint aches Skin:  no rashes, no hair loss Neurological: no tremors/no numbness/no tingling/no dizziness  I reviewed pt's medications, allergies, PMH, social hx, family hx, and changes were documented in the history of present illness. Otherwise, unchanged from my initial visit note.  Past Medical History:  Diagnosis Date  . Cataract    BEGINNING STAGES BILATERAL  . H/O abdominal hysterectomy   . Hyperlipidemia   . Hypertension    Past Surgical History:  Procedure Laterality Date   . ABDOMINAL HYSTERECTOMY    . COLONOSCOPY     Social History   Socioeconomic History  . Marital status: Married    Spouse name: richard  . Number of children: 2  . Years of education: Not on file  . Highest education level: Not on file  Occupational History  .  unemployed  Tobacco Use  . Smoking status: Never Smoker  . Smokeless tobacco: Never Used  Substance and Sexual Activity  . Alcohol use: Not Currently  . Drug use: Not Currently  . Sexual activity: Yes  Other Topics Concern  . Not on file  Social History Narrative  . Not on file   Social Determinants of Health   Financial Resource Strain:   . Difficulty of Paying Living Expenses: Not on file  Food Insecurity:   . Worried About Charity fundraiser in the Last Year: Not on file  . Ran Out of Food in the Last Year: Not on file  Transportation Needs:   . Lack of Transportation (Medical): Not on file  . Lack of Transportation (Non-Medical): Not on file  Physical Activity:   . Days of Exercise per Week: Not on file  . Minutes of Exercise per Session: Not on file  Stress:   . Feeling of Stress : Not on file  Social Connections:   . Frequency of Communication with Friends and Family: Not on file  . Frequency of Social Gatherings with Friends and Family: Not on file  . Attends Religious Services: Not on file  . Active Member of Clubs or Organizations: Not on file  . Attends Archivist Meetings: Not on file  . Marital Status: Not on file  Intimate Partner Violence:   . Fear of Current or Ex-Partner: Not on file  . Emotionally Abused: Not on file  . Physically Abused: Not on file  . Sexually Abused: Not on file   Current Outpatient Medications on File Prior to Visit  Medication Sig Dispense Refill  . amLODipine (NORVASC) 10 MG tablet Take 10 mg by mouth daily.    . hydrochlorothiazide (HYDRODIURIL) 25 MG tablet Take 12.5 mg by mouth daily.    Marland Kitchen levothyroxine (SYNTHROID) 25 MCG tablet TAKE 1 TABLET BY  MOUTH DAILY BEFORE BREAKFAST. 90 tablet 0  . propranolol ER (INDERAL LA) 120 MG 24 hr capsule Take 120 mg by mouth daily.     Current Facility-Administered Medications on File Prior to Visit  Medication Dose Route Frequency Provider Last Rate Last Admin  . 0.9 %  sodium chloride infusion  500 mL Intravenous Once [place], MD       No Known Allergies Family History  Problem Relation Age of Onset  . Hypertension Father   . Hyperlipidemia Father   . Colon cancer Paternal Grandfather   . Colon cancer Cousin   . Esophageal cancer Neg Hx   . Rectal cancer Neg Hx   . Stomach cancer Neg Hx    PE: BP 130/80   Pulse 62   Ht 5\' 4"  (  1.626 m)   Wt 159 lb (72.1 kg)   SpO2 97%   BMI 27.29 kg/m  Wt Readings from Last 3 Encounters:  09/10/19 159 lb (72.1 kg)  05/07/19 156 lb (70.8 kg)  03/05/19 157 lb (71.2 kg)   Constitutional: overweight, in NAD Eyes: PERRLA, EOMI, no exophthalmos ENT: moist mucous membranes, no thyromegaly, no cervical lymphadenopathy Cardiovascular: RRR, + 1/6 SEM, no RG, + PVCs Respiratory: CTA B Gastrointestinal: abdomen soft, NT, ND, BS+ Musculoskeletal: no deformities, strength intact in all 4 Skin: moist, warm, no rashes Neurological: no tremor with outstretched hands, DTR normal in all 4  ASSESSMENT: 1.  Subclinical hypothyroidism -Due to Hashimoto's thyroiditis  2. HL  PLAN:  1. Patient with longstanding subclinical hypothyroidism with fairly stable TSH level either at or slightly above the upper limit of normal and normal free thyroid hormones.  She had testing for Hashimoto's thyroiditis in 2016 and again at last visit and her TPO antibodies were elevated.  At that time, TSH was in the normal range so I did not suggest levothyroxine but I suggested selenium 200 mcg daily in an effort to decrease her thyroid inflammation. -At last visit she was mentioning mild/chronic fatigue and also constipation.   -At last visit we discussed about Hashimoto's  thyroiditis is an autoimmune disease in which her immune system is directed against her on thyroid.  The destruction of the thyroid will ultimately occur but the timing of this process is extremely subjective.  As of now, she does not appear to require levothyroxine.  Indeed, her disease appears to be very indolent, with fairly stable thyroid test over the last 6 to 7 years. -Since last visit, she sent me a message that she wanted to try a low-dose of levothyroxine.  We started 25 mcg daily. - pt feels does not feel a big difference in her symptoms on this dose, but maybe has more energy - we discussed about taking the thyroid hormone every day, with water, >30 minutes before breakfast, separated by >4 hours from acid reflux medications, calcium, iron, multivitamins. Pt. is taking it correctly. - will check thyroid tests today: TSH and fT4 and will also check her TPO antibodies to see if they responded to selenium.  If they did, we will continue selenium.  If the level is higher, will stop it. - She agrees to try a slightly higher dose of levothyroxine if the TSH is still slightly high in the normal range.  She does not feel a difference in her symptoms on this higher dose, she agrees to stop levothyroxine. - If labs are abnormal, she will need to return for repeat TFTs in 1.5 months - Otherwise, I will see her back in 6 months  2. HL -Patient has tried the available statins, some of them twice, with still significant constipation.  At one point this has been so severe that she had to go to emergency room for fecal disimpaction.  She did not want to try another statin. -PCP suggested visiting my but she was reticent to try due to possible constipation -At last visit, we discussed about changing diet to reduce cholesterol and animal proteins and transition to a more plant-based diet.  We also discussed about targets for LDL cholesterol and total cholesterol. -I also suggested Bempedoic acid but she was  undecided about this and wanted to think about it.  I gave her written information about the medication.  She did not contact me and she was afraid that this was  too expensive.  However, at this visit, she agrees to try it. -At this visit, we discussed about the option for referral to the lipid clinic for PCSK9 inhibitor: Praluent or Repatha. She would like to do this after she tries bempedoic acid, if it is not covered or she cannot tolerate it -We will recheck a lipid panel now.  Orders Placed This Encounter  Procedures  . TSH  . T4, free  . Thyroid peroxidase antibody  . Lipid panel   Component     Latest Ref Rng & Units 09/10/2019  Cholesterol     0 - 200 mg/dL 241 (H)  Triglycerides     0.0 - 149.0 mg/dL 176.0 (H)  HDL Cholesterol     >39.00 mg/dL 55.70  VLDL     0.0 - 40.0 mg/dL 35.2  LDL (calc)     0 - 99 mg/dL 150 (H)  Total CHOL/HDL Ratio      4  NonHDL      185.32  TSH     0.35 - 4.50 uIU/mL 3.91  T4,Free(Direct)     0.60 - 1.60 ng/dL 1.45  Thyroperoxidase Ab SerPl-aCnc     <9 IU/mL 82 (H)   No significant response to selenium.  TPO antibody still elevated.  I will advised her that she can stop it. TFTs normal, but still slightly high.  We can try to increase the dose of levothyroxine to 50 mcg daily and have her back for labs in 1.5 months. LDL slightly better, but I would still recommend Bempedoic acid I will send this again to her pharmacy.  Philemon Kingdom, MD PhD [facility]

## 2019-09-11 ENCOUNTER — Encounter: Payer: Self-pay | Admitting: Internal Medicine

## 2019-09-11 ENCOUNTER — Other Ambulatory Visit: Payer: Self-pay | Admitting: Internal Medicine

## 2019-09-11 LAB — THYROID PEROXIDASE ANTIBODY: Thyroperoxidase Ab SerPl-aCnc: 82 IU/mL — ABNORMAL HIGH (ref <9)

## 2019-09-11 MED ORDER — BEMPEDOIC ACID 180 MG PO TABS: ORAL_TABLET | ORAL | 5 refills | Status: AC

## 2019-09-11 MED ORDER — LEVOTHYROXINE SODIUM 50 MCG PO TABS: ORAL_TABLET | ORAL | 3 refills | Status: DC

## 2019-09-14 ENCOUNTER — Encounter: Payer: Self-pay | Admitting: Internal Medicine

## 2019-09-16 MED ORDER — LEVOTHYROXINE SODIUM 50 MCG PO TABS: ORAL_TABLET | ORAL | 3 refills | Status: DC

## 2019-10-06 ENCOUNTER — Encounter: Payer: Self-pay | Admitting: Internal Medicine

## 2019-10-06 DIAGNOSIS — E038 Other specified hypothyroidism: Secondary | ICD-10-CM

## 2019-10-15 ENCOUNTER — Other Ambulatory Visit: Payer: Self-pay

## 2019-10-15 ENCOUNTER — Encounter: Payer: Self-pay | Admitting: Internal Medicine

## 2019-10-15 ENCOUNTER — Other Ambulatory Visit (INDEPENDENT_AMBULATORY_CARE_PROVIDER_SITE_OTHER): Payer: Medicare HMO

## 2019-10-15 DIAGNOSIS — E038 Other specified hypothyroidism: Secondary | ICD-10-CM

## 2019-10-15 DIAGNOSIS — E039 Hypothyroidism, unspecified: Secondary | ICD-10-CM

## 2019-10-15 LAB — T4, FREE: Free T4: 1.31 ng/dL (ref 0.60–1.60)

## 2019-10-15 LAB — TSH: TSH: 2.35 u[IU]/mL (ref 0.35–4.50)

## 2019-10-15 LAB — T3, FREE: T3, Free: 2.8 pg/mL (ref 2.3–4.2)

## 2019-10-16 ENCOUNTER — Other Ambulatory Visit: Payer: Self-pay | Admitting: Internal Medicine

## 2020-02-01 DIAGNOSIS — R69 Illness, unspecified: Secondary | ICD-10-CM | POA: Diagnosis not present

## 2020-03-17 ENCOUNTER — Ambulatory Visit: Payer: Medicare HMO | Admitting: Internal Medicine

## 2020-03-25 DIAGNOSIS — R69 Illness, unspecified: Secondary | ICD-10-CM | POA: Diagnosis not present

## 2020-04-13 DIAGNOSIS — E7800 Pure hypercholesterolemia, unspecified: Secondary | ICD-10-CM | POA: Diagnosis not present

## 2020-04-13 DIAGNOSIS — K5901 Slow transit constipation: Secondary | ICD-10-CM | POA: Diagnosis not present

## 2020-04-13 DIAGNOSIS — R69 Illness, unspecified: Secondary | ICD-10-CM | POA: Diagnosis not present

## 2020-04-13 DIAGNOSIS — I10 Essential (primary) hypertension: Secondary | ICD-10-CM | POA: Diagnosis not present

## 2020-06-01 DIAGNOSIS — Z1231 Encounter for screening mammogram for malignant neoplasm of breast: Secondary | ICD-10-CM | POA: Diagnosis not present

## 2020-06-16 DIAGNOSIS — K029 Dental caries, unspecified: Secondary | ICD-10-CM | POA: Diagnosis not present

## 2020-06-26 DIAGNOSIS — H2513 Age-related nuclear cataract, bilateral: Secondary | ICD-10-CM | POA: Diagnosis not present

## 2020-06-26 DIAGNOSIS — H5203 Hypermetropia, bilateral: Secondary | ICD-10-CM | POA: Diagnosis not present

## 2020-07-01 DIAGNOSIS — I10 Essential (primary) hypertension: Secondary | ICD-10-CM | POA: Diagnosis not present

## 2020-07-01 DIAGNOSIS — R946 Abnormal results of thyroid function studies: Secondary | ICD-10-CM | POA: Diagnosis not present

## 2020-07-01 DIAGNOSIS — E7800 Pure hypercholesterolemia, unspecified: Secondary | ICD-10-CM | POA: Diagnosis not present

## 2020-07-01 DIAGNOSIS — E559 Vitamin D deficiency, unspecified: Secondary | ICD-10-CM | POA: Diagnosis not present

## 2020-07-27 DIAGNOSIS — K029 Dental caries, unspecified: Secondary | ICD-10-CM | POA: Diagnosis not present

## 2020-09-05 IMAGING — CR ABDOMEN - 1 VIEW
1 series · 1 of 1 positions shown · non-contrast
Comparison: None.

CLINICAL DATA: Constipation.  Abdominal discomfort.  Nausea.

EXAM:
ABDOMEN - 1 VIEW

[abdomen kub]
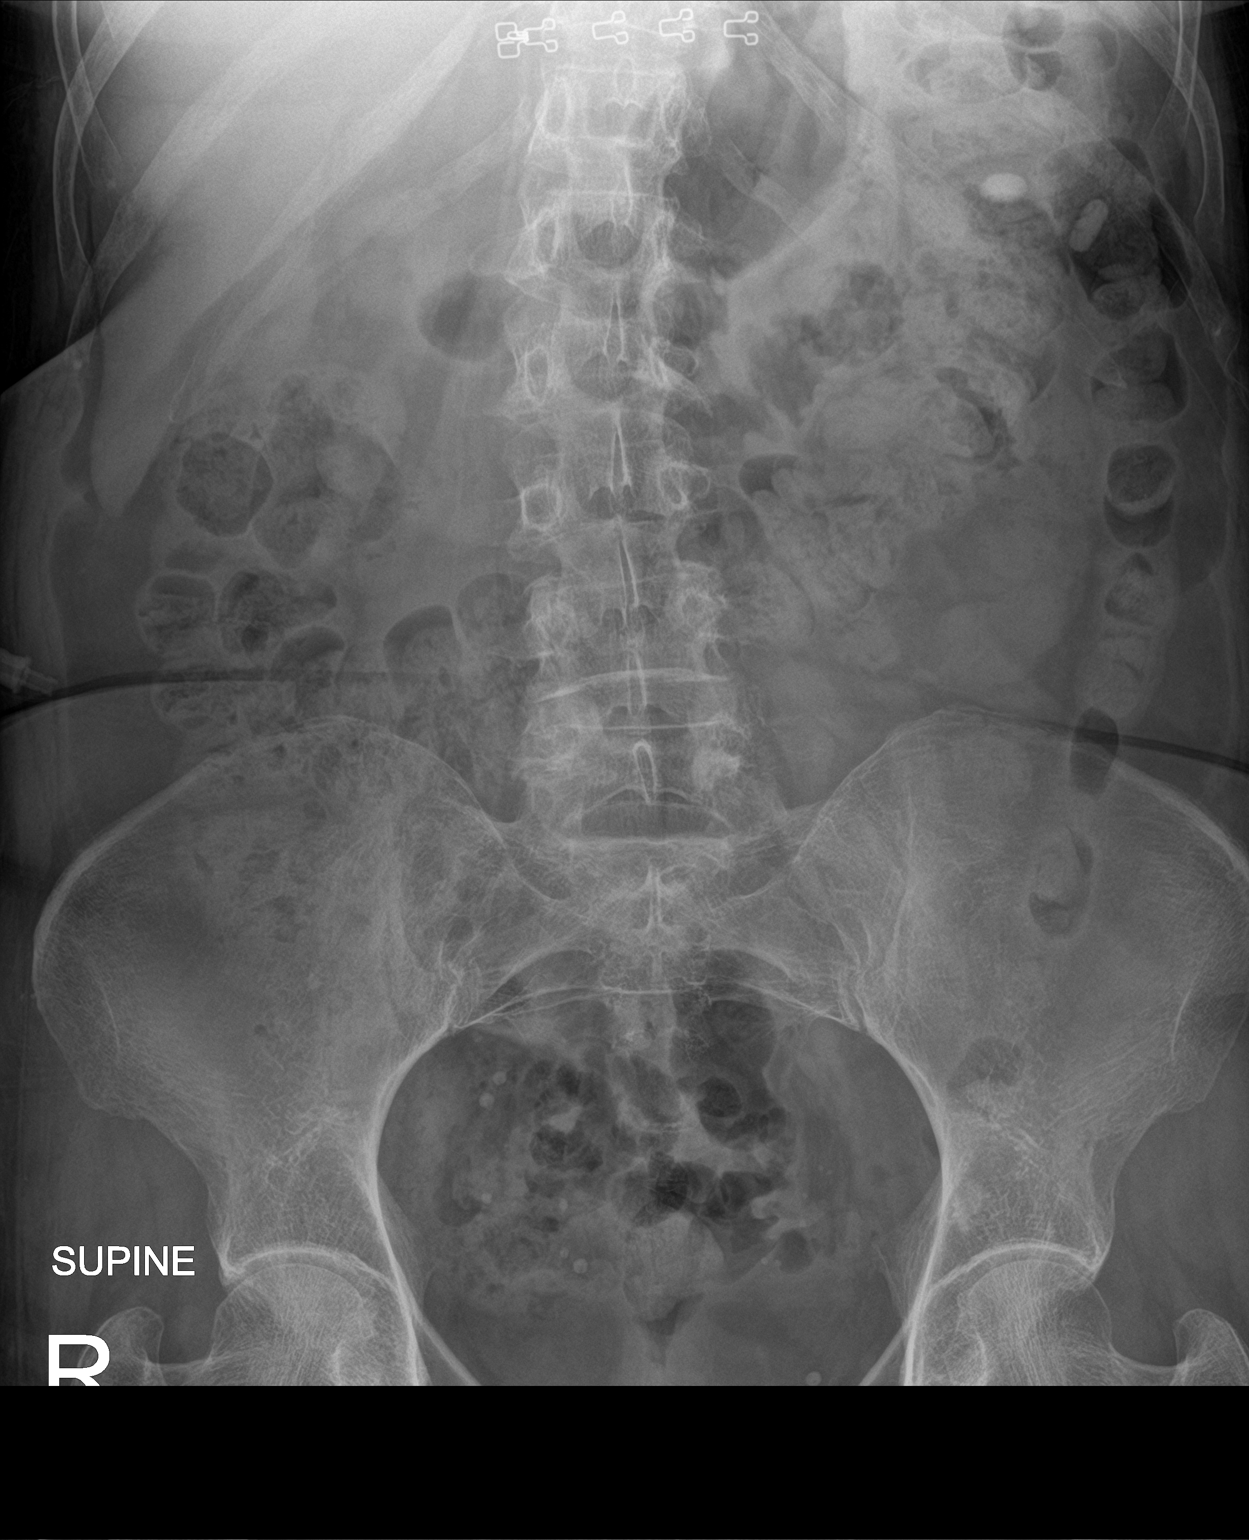

[1 of 1 positions shown; findings below may reference images not displayed]

FINDINGS: The bowel gas pattern is normal. No radio-opaque calculi or other
significant radiographic abnormality are seen.
IMPRESSION: Benign-appearing abdomen.

## 2021-01-28 DIAGNOSIS — Z0100 Encounter for examination of eyes and vision without abnormal findings: Secondary | ICD-10-CM | POA: Diagnosis not present

## 2021-03-03 DIAGNOSIS — Z0100 Encounter for examination of eyes and vision without abnormal findings: Secondary | ICD-10-CM | POA: Diagnosis not present

## 2021-04-14 DIAGNOSIS — E038 Other specified hypothyroidism: Secondary | ICD-10-CM | POA: Diagnosis not present

## 2021-04-14 DIAGNOSIS — E7800 Pure hypercholesterolemia, unspecified: Secondary | ICD-10-CM | POA: Diagnosis not present

## 2021-04-14 DIAGNOSIS — K5901 Slow transit constipation: Secondary | ICD-10-CM | POA: Diagnosis not present

## 2021-04-14 DIAGNOSIS — R69 Illness, unspecified: Secondary | ICD-10-CM | POA: Diagnosis not present

## 2021-04-14 DIAGNOSIS — I10 Essential (primary) hypertension: Secondary | ICD-10-CM | POA: Diagnosis not present

## 2021-06-07 DIAGNOSIS — H524 Presbyopia: Secondary | ICD-10-CM | POA: Diagnosis not present

## 2021-06-07 DIAGNOSIS — H52221 Regular astigmatism, right eye: Secondary | ICD-10-CM | POA: Diagnosis not present

## 2021-06-07 DIAGNOSIS — Z1231 Encounter for screening mammogram for malignant neoplasm of breast: Secondary | ICD-10-CM | POA: Diagnosis not present

## 2022-06-02 DIAGNOSIS — Z23 Encounter for immunization: Secondary | ICD-10-CM | POA: Diagnosis not present

## 2022-06-02 DIAGNOSIS — Z6827 Body mass index (BMI) 27.0-27.9, adult: Secondary | ICD-10-CM | POA: Diagnosis not present

## 2022-06-02 DIAGNOSIS — E038 Other specified hypothyroidism: Secondary | ICD-10-CM | POA: Diagnosis not present

## 2022-06-02 DIAGNOSIS — E7800 Pure hypercholesterolemia, unspecified: Secondary | ICD-10-CM | POA: Diagnosis not present

## 2022-06-02 DIAGNOSIS — E663 Overweight: Secondary | ICD-10-CM | POA: Diagnosis not present

## 2022-06-02 DIAGNOSIS — R69 Illness, unspecified: Secondary | ICD-10-CM | POA: Diagnosis not present

## 2022-06-02 DIAGNOSIS — I10 Essential (primary) hypertension: Secondary | ICD-10-CM | POA: Diagnosis not present

## 2022-06-14 DIAGNOSIS — E559 Vitamin D deficiency, unspecified: Secondary | ICD-10-CM | POA: Diagnosis not present

## 2022-06-21 DIAGNOSIS — Z1231 Encounter for screening mammogram for malignant neoplasm of breast: Secondary | ICD-10-CM | POA: Diagnosis not present

## 2022-09-14 DIAGNOSIS — H5203 Hypermetropia, bilateral: Secondary | ICD-10-CM | POA: Diagnosis not present

## 2022-09-14 DIAGNOSIS — H25043 Posterior subcapsular polar age-related cataract, bilateral: Secondary | ICD-10-CM | POA: Diagnosis not present

## 2022-09-14 DIAGNOSIS — H04123 Dry eye syndrome of bilateral lacrimal glands: Secondary | ICD-10-CM | POA: Diagnosis not present

## 2022-09-14 DIAGNOSIS — H2513 Age-related nuclear cataract, bilateral: Secondary | ICD-10-CM | POA: Diagnosis not present

## 2023-06-07 DIAGNOSIS — E7800 Pure hypercholesterolemia, unspecified: Secondary | ICD-10-CM | POA: Diagnosis not present

## 2023-06-07 DIAGNOSIS — I10 Essential (primary) hypertension: Secondary | ICD-10-CM | POA: Diagnosis not present

## 2023-06-07 DIAGNOSIS — E559 Vitamin D deficiency, unspecified: Secondary | ICD-10-CM | POA: Diagnosis not present

## 2023-06-07 DIAGNOSIS — E038 Other specified hypothyroidism: Secondary | ICD-10-CM | POA: Diagnosis not present

## 2023-07-12 DIAGNOSIS — Z1231 Encounter for screening mammogram for malignant neoplasm of breast: Secondary | ICD-10-CM | POA: Diagnosis not present

## 2023-09-15 DIAGNOSIS — H524 Presbyopia: Secondary | ICD-10-CM | POA: Diagnosis not present

## 2023-09-15 DIAGNOSIS — H2513 Age-related nuclear cataract, bilateral: Secondary | ICD-10-CM | POA: Diagnosis not present

## 2023-09-15 DIAGNOSIS — H40053 Ocular hypertension, bilateral: Secondary | ICD-10-CM | POA: Diagnosis not present

## 2023-09-15 DIAGNOSIS — H25043 Posterior subcapsular polar age-related cataract, bilateral: Secondary | ICD-10-CM | POA: Diagnosis not present

## 2023-10-03 DIAGNOSIS — E7800 Pure hypercholesterolemia, unspecified: Secondary | ICD-10-CM | POA: Diagnosis not present

## 2023-12-12 DIAGNOSIS — Z961 Presence of intraocular lens: Secondary | ICD-10-CM | POA: Diagnosis not present

## 2023-12-12 DIAGNOSIS — H2511 Age-related nuclear cataract, right eye: Secondary | ICD-10-CM | POA: Diagnosis not present

## 2023-12-12 DIAGNOSIS — H25041 Posterior subcapsular polar age-related cataract, right eye: Secondary | ICD-10-CM | POA: Diagnosis not present

## 2023-12-12 DIAGNOSIS — H25811 Combined forms of age-related cataract, right eye: Secondary | ICD-10-CM | POA: Diagnosis not present

## 2023-12-26 DIAGNOSIS — H25042 Posterior subcapsular polar age-related cataract, left eye: Secondary | ICD-10-CM | POA: Diagnosis not present

## 2023-12-26 DIAGNOSIS — H25812 Combined forms of age-related cataract, left eye: Secondary | ICD-10-CM | POA: Diagnosis not present

## 2023-12-26 DIAGNOSIS — H2512 Age-related nuclear cataract, left eye: Secondary | ICD-10-CM | POA: Diagnosis not present

## 2023-12-26 DIAGNOSIS — Z961 Presence of intraocular lens: Secondary | ICD-10-CM | POA: Diagnosis not present

## 2024-04-11 DIAGNOSIS — H5310 Unspecified subjective visual disturbances: Secondary | ICD-10-CM | POA: Diagnosis not present

## 2024-04-11 DIAGNOSIS — Z961 Presence of intraocular lens: Secondary | ICD-10-CM | POA: Diagnosis not present
